# Patient Record
Sex: Female | Born: 1960 | Race: White | Hispanic: Yes | Marital: Married | Smoking: Never smoker
Health system: Southern US, Community
[De-identification: ages and names within clinical notes are randomized; demographics above are authoritative.]

## PROBLEM LIST (undated history)

## (undated) HISTORY — PX: TUBAL LIGATION: SHX77

---

## 1998-02-24 ENCOUNTER — Other Ambulatory Visit: Admission: RE | Admit: 1998-02-24 | Discharge: 1998-02-24 | Payer: Self-pay | Admitting: Obstetrics and Gynecology

## 1998-07-28 ENCOUNTER — Encounter: Admission: RE | Admit: 1998-07-28 | Discharge: 1998-10-26 | Payer: Self-pay | Admitting: Gynecology

## 1998-09-22 ENCOUNTER — Inpatient Hospital Stay (HOSPITAL_COMMUNITY): Admission: AD | Admit: 1998-09-22 | Discharge: 1998-09-25 | Payer: Self-pay | Admitting: Gynecology

## 1998-09-25 ENCOUNTER — Encounter (HOSPITAL_COMMUNITY): Admission: RE | Admit: 1998-09-25 | Discharge: 1998-12-24 | Payer: Self-pay | Admitting: Gynecology

## 1998-10-27 ENCOUNTER — Other Ambulatory Visit: Admission: RE | Admit: 1998-10-27 | Discharge: 1998-10-27 | Payer: Self-pay | Admitting: Gynecology

## 1999-12-25 ENCOUNTER — Inpatient Hospital Stay (HOSPITAL_COMMUNITY): Admission: AD | Admit: 1999-12-25 | Discharge: 1999-12-28 | Payer: Self-pay | Admitting: Obstetrics and Gynecology

## 1999-12-25 ENCOUNTER — Encounter (INDEPENDENT_AMBULATORY_CARE_PROVIDER_SITE_OTHER): Payer: Self-pay | Admitting: Specialist

## 2000-01-25 ENCOUNTER — Other Ambulatory Visit: Admission: RE | Admit: 2000-01-25 | Discharge: 2000-01-25 | Payer: Self-pay | Admitting: Obstetrics and Gynecology

## 2001-10-27 ENCOUNTER — Other Ambulatory Visit: Admission: RE | Admit: 2001-10-27 | Discharge: 2001-10-27 | Payer: Self-pay | Admitting: Obstetrics and Gynecology

## 2004-12-07 ENCOUNTER — Ambulatory Visit: Payer: Self-pay | Admitting: Internal Medicine

## 2004-12-07 ENCOUNTER — Other Ambulatory Visit: Admission: RE | Admit: 2004-12-07 | Discharge: 2004-12-07 | Payer: Self-pay | Admitting: Internal Medicine

## 2006-06-12 ENCOUNTER — Ambulatory Visit: Payer: Self-pay | Admitting: Internal Medicine

## 2007-06-30 ENCOUNTER — Ambulatory Visit: Payer: Self-pay | Admitting: Internal Medicine

## 2007-06-30 DIAGNOSIS — M722 Plantar fascial fibromatosis: Secondary | ICD-10-CM | POA: Insufficient documentation

## 2007-07-02 ENCOUNTER — Telehealth (INDEPENDENT_AMBULATORY_CARE_PROVIDER_SITE_OTHER): Payer: Self-pay | Admitting: *Deleted

## 2007-07-03 ENCOUNTER — Telehealth (INDEPENDENT_AMBULATORY_CARE_PROVIDER_SITE_OTHER): Payer: Self-pay | Admitting: *Deleted

## 2009-06-28 ENCOUNTER — Other Ambulatory Visit: Admission: RE | Admit: 2009-06-28 | Discharge: 2009-06-28 | Payer: Self-pay | Admitting: Gynecology

## 2009-06-28 ENCOUNTER — Ambulatory Visit: Payer: Self-pay | Admitting: Gynecology

## 2009-08-29 IMAGING — CR DG FOOT COMPLETE 3+V*L*
3 series · 3 of 3 positions shown · non-contrast
Comparison: none

CLINICAL DATA: Left plantar fasciitis, medial left heel pain for 4 months. 
 LEFT FOOT - 3 VIEW:

[view not recorded (1 of 3)]
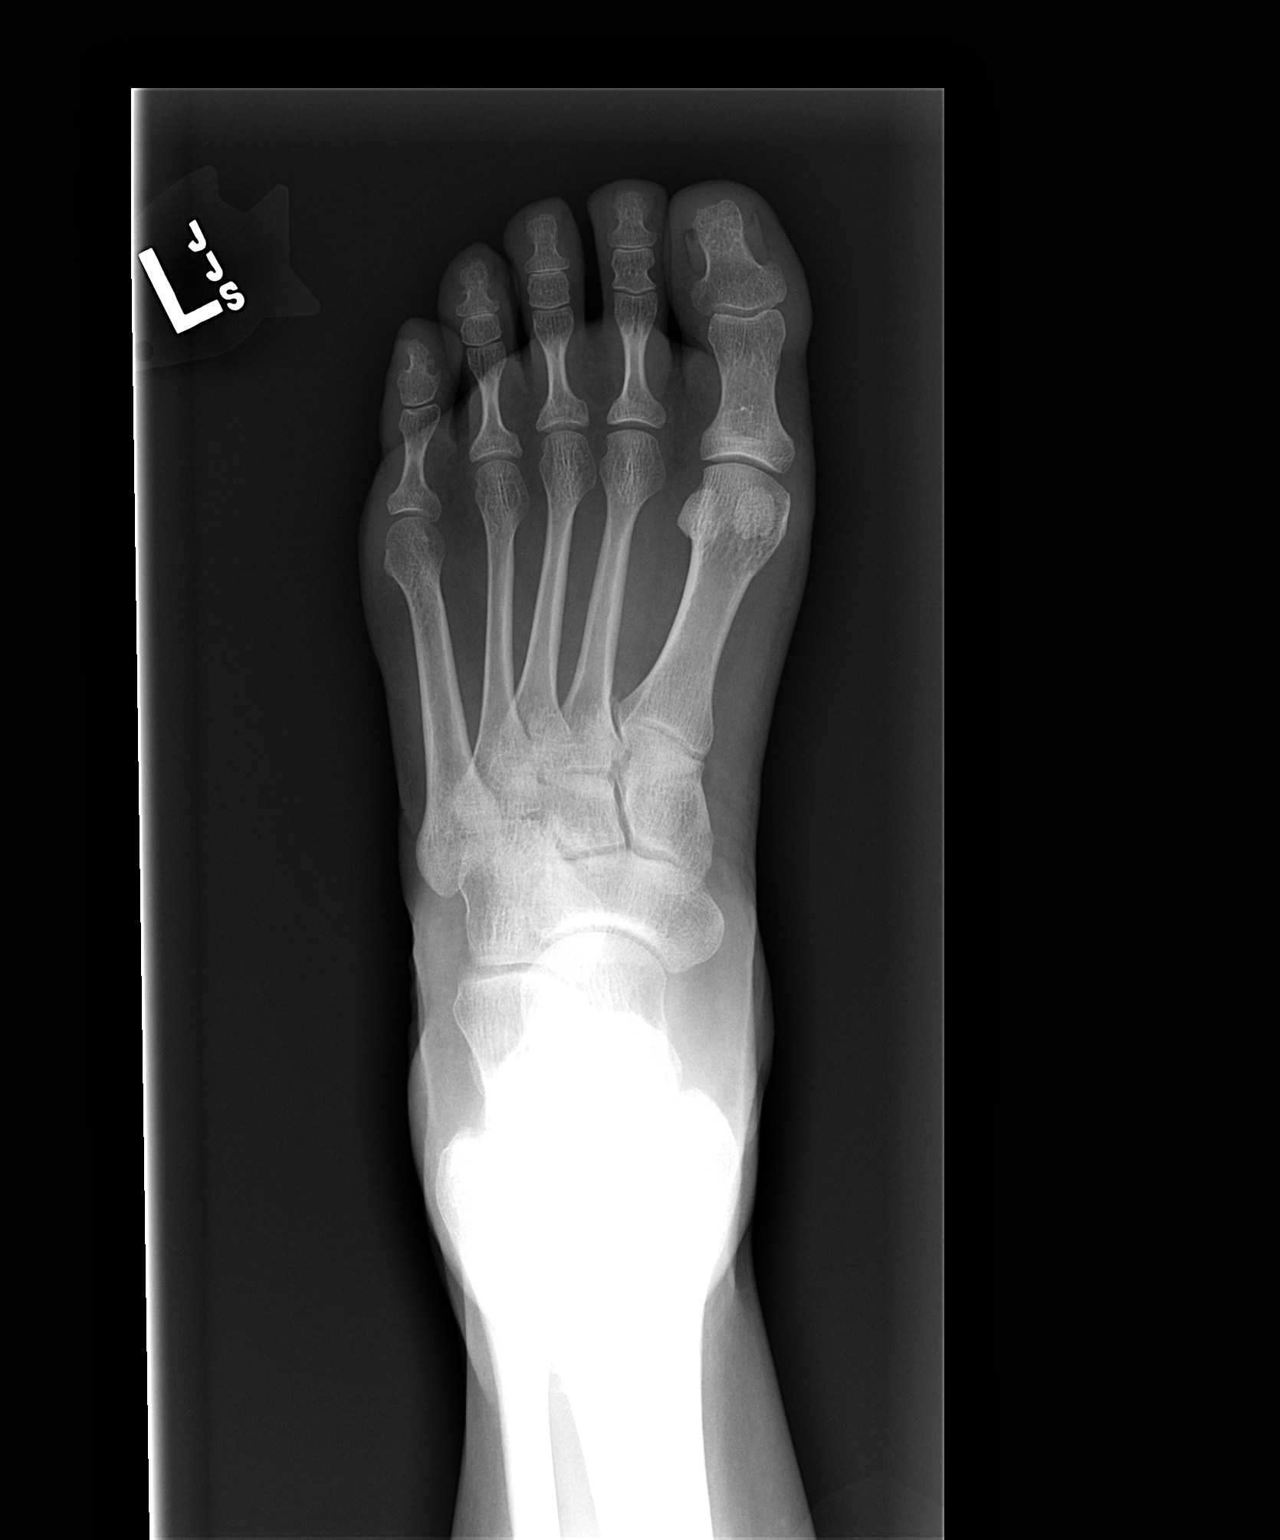

[view not recorded (2 of 3)]
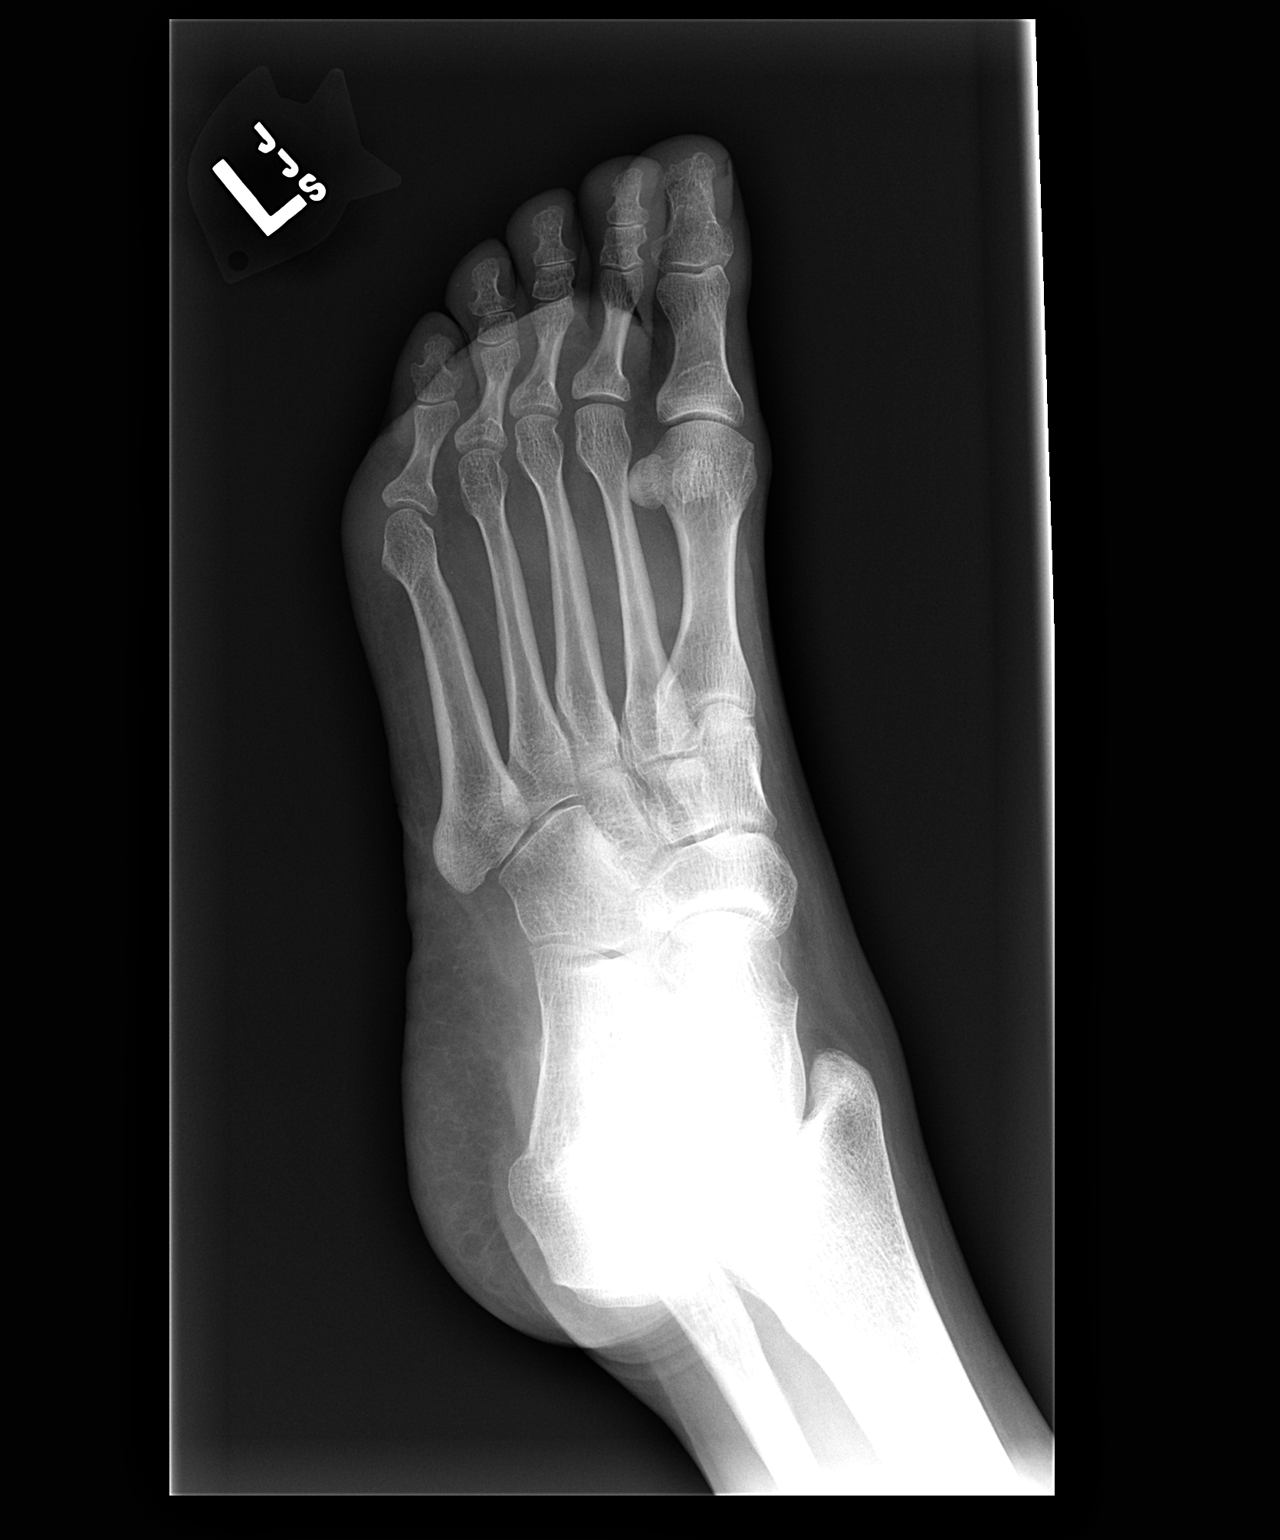

[view not recorded (3 of 3)]
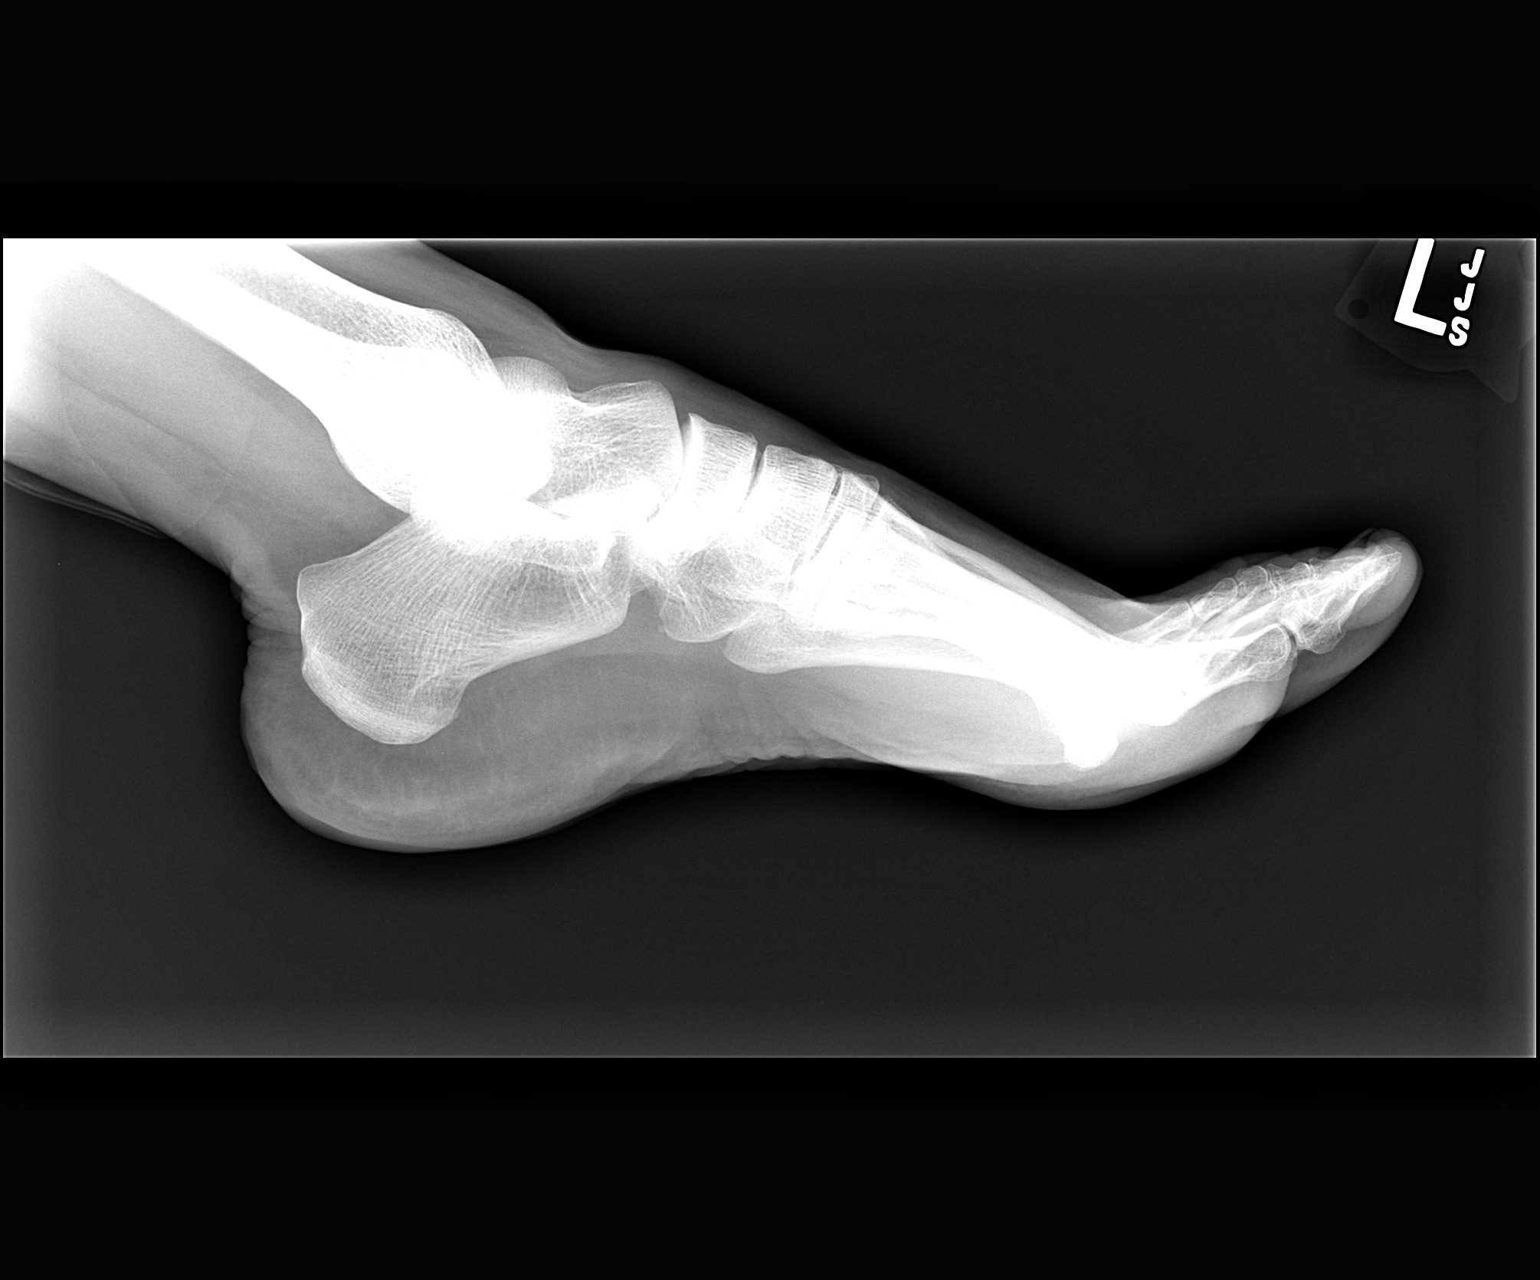

[3 of 3 positions shown; findings below may reference images not displayed]

FINDINGS: There is no evidence of fracture or dislocation.  There is no evidence of arthropathy or other focal bone abnormality.  Soft tissues are unremarkable.  No evidence of a calcaneal plantar bony spur.  IMPRESSION:
 Negative left foot.

## 2010-07-11 ENCOUNTER — Ambulatory Visit (INDEPENDENT_AMBULATORY_CARE_PROVIDER_SITE_OTHER): Payer: BC Managed Care – PPO | Admitting: Internal Medicine

## 2010-07-11 ENCOUNTER — Encounter: Payer: Self-pay | Admitting: Internal Medicine

## 2010-07-11 DIAGNOSIS — R110 Nausea: Secondary | ICD-10-CM | POA: Insufficient documentation

## 2010-07-17 ENCOUNTER — Telehealth: Payer: Self-pay | Admitting: Internal Medicine

## 2010-07-19 NOTE — Assessment & Plan Note (Signed)
Summary: vomiting all night, stomache///sph   Vital Signs:  Patient profile:   50 year old female Weight:      191 pounds Temp:     98.4 degrees F oral Pulse rate:   82 / minute Pulse rhythm:   regular BP sitting:   130 / 84  (left arm) Cuff size:   large  Vitals Entered By: Army Fossa CMA (July 11, 2010 11:57 AM) CC: Pt here c/o vomitting since yesterday- no diarrhea  Comments [facility] [address]   History of Present Illness:  sudden onset of nausea and vomiting yesterday  vomited few times   unable to tolerate food, she tried to drink milk and couldn't.  also some epigastric burning.  this morning she still feels slightly nauseated and  vomited once , yellow fluid.   Review of systems no known sick contacts that she can tell Denies any fevers No diarrhea No hematemesis or melena  feels slightly weak but not dizzy Has not taken any NSAIDs lately Prior to this episode , she did not have any heartburn.   Current Medications (verified): 1)  None  Allergies (verified): No Known Drug Allergies  Past History:  Past Medical History: healthy   Past Surgical History: Reviewed history from 06/30/2007 and no changes required. Caesarean section x 2 Tubal ligation  Social History: Married 2 kids from [place] ETOH-- rarely Tobacco--no  Physical Exam  General:  alert and well-developed.   no apparent distress Lungs:  normal respiratory effort, no intercostal retractions, no accessory muscle use, and normal breath sounds.   Heart:  normal rate, regular rhythm, and no murmur.   Abdomen:   nondistended, soft, moderate tenderness and epigastric area without mass or rebound , normal bowel sounds   Impression & Recommendations:  Problem # 1:  NAUSEA (ICD-787.02)  nausea and vomiting since yesterday, some epigastric pain DDX includes acute gastroenteritis, recent cases has been seen in the community. Other etiologies include PUD or a gallbladder problem.  plan is to take lots of fluids, treat symptoms with Zofran and Prilosec. If she is no better she knows to call   and further workup will be prescribed . Instructions provided in Spanish  Her updated medication list for this problem includes:    Zofran 4 Mg Tabs (Ondansetron hcl) .Marland Kitchen... 1 or 2 by mouth every 6 hours as neede for nauea  Complete Medication List: 1)  Zofran 4 Mg Tabs (Ondansetron hcl) .Marland Kitchen.. 1 or 2 by mouth every 6 hours as neede for nauea  Patient Instructions: 1)  dieta blanda, muchos liquidos 2)  si tiene nausea, tome zofran (ondansetron) 1 o 2 tabletas cada 6 horas  3)  prilosec 1 tableta al dia x 1 semana (samples) ; tomelo con el estomago vacio 4)  llame si se pone peor, tiene fiebre, aumenta el dolor  or no esta mejor en 1 o 2 dias  Prescriptions: ZOFRAN 4 MG TABS (ONDANSETRON HCL) 1 or 2 by mouth every 6 hours as neede for nauea  #20 x 0   Entered and Authorized by:   Elita Quick E. Raynee Mccasland MD   Signed by:   Nolon Rod. Rue Tinnel MD on 07/11/2010   Method used:   Print then Give to Patient   RxID:   [id number]    Orders Added: 1)  Est. Patient Level III [46962]

## 2010-07-24 NOTE — Progress Notes (Signed)
Summary: pt status  ---- Converted from flag ---- ---- 07/17/2010 8:06 AM, Cadi Rhinehart E. Iniko Robles MD wrote: please check on patient, Nausea, pain...better? ------------------------------  I called pt on home #, not working. I called the work # and I believe pt only speaks spanish, she could not understand me. Army Fossa CMA  July 17, 2010 2:29 PM  [facility], checking on patient, asked to call me if no better [facility] E. Hameed Kolar MD  July 17, 2010 4:05 PM

## 2010-09-28 NOTE — Discharge Summary (Signed)
[facility]  Patient:    Natalie [name]                     MRN: [id number] Adm. Date:  [id number] Disc. Date: [id number] Attending:  Ardeen Fillers                           Discharge Summary  ADMISSION DIAGNOSES:          1. Term intrauterine pregnancy.                               2. History of previous low transverse cesarean                                  section, desires repeat.                               3. Undesired fertility.  PROCEDURES:                   1. Repeat low transverse cesarean section.                               2. Bilateral tubal ligation.  HISTORY OF PRESENT ILLNESS:   For complete details, please see the history and physical in the chart.  However, in brief, Natalie [name] is a 50 year old Hispanic female, gravida 3, para 1, abortus 1, who presented at 38-5/7 weeks with spontaneous rupture of membranes at 0500 on the day of admission.  At that time, she was not contracting and reported active fetus and no vaginal bleeding.  Her prenatal care was at [facility].  Primary was Dr. Billy Coast. Complicated by advanced maternal age and weight entering the prenatal care at 23 weeks.  Otherwise, no problems.  Last menstrual period unsure.  EDC was based on a 23-week ultrasound.  PRENATAL LABORATORY DATA:     A positive.  Rubella equivocal.  Group B strep positive.  Hepatitis B and RPR negative.  Glucola 136.  HOSPITAL COURSE:              The patient was admitted for repeat cesarean section as she had declined a trial of labor.  She underwent repeat low transverse cesarean section on the day of admission and bilateral tubal ligation.  The procedure went without complications.  The patients postoperative course was notable for a rapid return of ability to ambulate, void, and tolerate a regular diet.  She remained afebrile, with stable vital signs throughout her stay.  She was discharged home on her third postoperative day in  satisfactory condition.  Her postoperative hemoglobin was 11.0.  She was again noted to be A positive and rubella equivocal.  Rubella vaccine will be given prior to discharge.  She plans to breast-feed and has had tubal ligation for birth control.  DISCHARGE INSTRUCTIONS:       Standardized postoperative instructions were given to the patient prior to discharge.  FOLLOW-UP:                    In four weeks in the office.  MEDICATIONS:  1. Prescription for Tylox 1-2 every four to six                                  hours as needed for pain.                               2. Instructed to continue prenatal vitamins. DD:  12/28/99 TD:  12/30/99 Job: [id number]

## 2010-09-28 NOTE — Op Note (Signed)
[facility]  Patient:    Natalie [name]                     MRN: [id number] Proc. Date: 12/25/99 Adm. Date:  [id number] Attending:  Ardeen Fillers                           Operative Report  PREOPERATIVE DIAGNOSES:       1. Intrauterine pregnancy at 38 5/7 weeks.                               2. Spontaneous rupture of membranes.                               3. History of previous low transverse cesarean                                  section; desires repeat.                               4. Undesired fertility.  POSTOPERATIVE DIAGNOSES:      1. Intrauterine pregnancy at 38 5/7 weeks.                               2. Spontaneous rupture of membranes.                               3. History of previous low transverse cesarean                                  section; desires repeat.                               4. Undesired fertility.                               5. Thin meconium.  PROCEDURE:                    Repeat low transverse cesarean section and bilateral tubal ligation, Pomeroy method.  SURGEON:                      Marina Gravel, M.D.  ASSISTANT:                    Sung Amabile. Roslyn Smiling, M.D.  ANESTHESIA:                   Spinal.  FINDINGS:                     Viable female infant.  Apgars 8 and 9.  Thin meconium.  Weight is pending at the nursery.  Normal uterus, tubes, and ovaries.  SPECIMEN:                     Segment of left and right tube.  COMPLICATIONS:  None.  DRAIN:                        Foley catheter.  ESTIMATED BLOOD LOSS:         1000 cc.  PROCEDURE:                    Patient was taken to the operating room and spinal anesthesia was obtained.  She was prepped and draped in a standard fashion in the supine position with a leftward tilt.                                A Pfannenstiel incision was made with a knife through the patients previous scar.  It was carried sharply with the knife to the  underlying fascia.  The fascia was divided in the midline and the incision extended laterally with Mayo scissors.  ______ clamps were used to elevate the superior aspect of the incision and the underlying rectus muscles were dissected off sharply.  Repeated inferiorly in a similar fashion.                                Midline and rectus muscles was identified.  The underlying peritoneum isolated, elevated with hemostats, and entered sharply with Metzenbaum scissors.  Extended superiorly and inferiorly with adequate visualization of the surrounding organs.                                A bladder blade was then inserted and the vesicouterine peritoneum identified, elevated, and bladder flap created with sharp and blunt technique.  A very thin lower uterine segment was noted; however, there was no obvious window.  The uterine incision was made sharply with the knife.  The incision was then extended laterally with bandage scissors.  On amniotomy thin meconium was noted.  The head was delivered through the incision and the nose and mouth suctioned with the ball suction. The remainder of the infant was delivered without difficulty.  The cord was clamped and cut and the infant delivered to the awaiting attendants.                                Placenta was removed by expression and the uterus exteriorized and cleared of all clots and debris.  The incision was inspected and was free of extension.  The incision was then closed in a running locked stitch of 0 Monocryl.  Hemostasis was obtained.                                The left tube was then identified and followed to its fimbriated end.  Tube was then grasped with the Babcock clamp approximately 2 cm distal to the cornua.  A 2 cm segment of the tube was then isolated, doubly tied with a plain suture, and divided sharply with Metzenbaum scissors.  Hemostasis was obtained with a Bovie cautery.  Similar procedure repeated on the opposite  side.                                The  uterus was turned to the abdomen and the pelvis was irrigated.  The uterine incision and tubal incisions were reinspected and were hemostatic.  Subfascial space was inspected and was hemostatic as was the bladder flap.                                The fascia was then closed in a running stitch of 0 Vicryl.  The subcutaneous tissue was irrigated and made hemostatic with the Bovie.  Skin was closed with staples.                                The patient tolerated the procedure well and there were no complications.  She was taken to the recovery room in awake, alert, and in stable condition.  All counts were correct ______.DD:  12/25/99 TD:  12/25/99 Job: [id number]

## 2012-02-25 ENCOUNTER — Encounter: Payer: Self-pay | Admitting: Gynecology

## 2012-02-25 ENCOUNTER — Ambulatory Visit (INDEPENDENT_AMBULATORY_CARE_PROVIDER_SITE_OTHER): Payer: BC Managed Care – PPO | Admitting: Gynecology

## 2012-02-25 ENCOUNTER — Other Ambulatory Visit (HOSPITAL_COMMUNITY)
Admission: RE | Admit: 2012-02-25 | Discharge: 2012-02-25 | Disposition: A | Discharge status: Home / Self Care | Payer: BC Managed Care – PPO | Source: Ambulatory Visit | Attending: Gynecology | Admitting: Gynecology

## 2012-02-25 VITALS — BP 128/78 | Ht 62.0 in | Wt 192.0 lb

## 2012-02-25 DIAGNOSIS — Z01419 Encounter for gynecological examination (general) (routine) without abnormal findings: Secondary | ICD-10-CM

## 2012-02-25 DIAGNOSIS — R635 Abnormal weight gain: Secondary | ICD-10-CM

## 2012-02-25 DIAGNOSIS — Z833 Family history of diabetes mellitus: Secondary | ICD-10-CM

## 2012-02-25 DIAGNOSIS — Z1151 Encounter for screening for human papillomavirus (HPV): Secondary | ICD-10-CM | POA: Insufficient documentation

## 2012-02-25 LAB — CBC WITH DIFFERENTIAL/PLATELET
Hemoglobin: 14.1 g/dL (ref 12.0–15.0)
Lymphocytes Relative: 28 % (ref 12–46)
MCHC: 32.6 g/dL (ref 30.0–36.0)
Neutro Abs: 3.6 10*3/uL (ref 1.7–7.7)
Neutrophils Relative %: 65 % (ref 43–77)
RDW: 13.8 % (ref 11.5–15.5)
WBC: 5.5 10*3/uL (ref 4.0–10.5)

## 2012-02-25 LAB — HEMOGLOBIN A1C
Hgb A1c MFr Bld: 5.3 % (ref <5.7)
Mean Plasma Glucose: 105 mg/dL (ref <117)

## 2012-02-25 NOTE — Patient Instructions (Addendum)
Vacuna contra la difteria, el ttanos, y la tos ferina Lo que usted necesita saber (Diphtheria, Tetanus, and Pertussis [DTaP] Vaccine) POR QU VACUNARSE? La difteria, el ttano y la tos [place] son enfermedades graves provocadas por bacterias. La difteria y la tos [place] se [place] de persona a Social worker. El ttano ingresa al cuerpo a travs de cortes o heridas. La difteria produce un recubrimiento denso en la parte trasera de la garganta.  Puede producir problemas para respirar, parlisis, insuficiencia cardaca, e incluso la muerte. El ttanos causa contracturas dolorosas de los msculos, a menudo en todo el cuerpo.  Puede ocasionar un "bloqueo" de la [place], de modo que es imposible abrir la boca o tragar. El ttanos produce la muerte en alrededor de 2 cada 10 casos. La tos ferina produce ataques de tos tan fuertes que, en nios, imposibilita comer, beber o respirar. Estos ataques pueden durar semanas.  Puede producir neumona, convulsiones (ataques de espasmos o ausencias), dao cerebral, y la muerte. La vacuna para la difteria, el ttano y la tos [place] (DTPa) puede ayudar a Market researcher. La mayor parte de los nios que la reciben estarn protegidos durante toda su niez. Muchos ms nios padeceran estas enfermedades si no fueran vacunados. La DTPa es una versin ms segura de una vacuna anterior denominada DTP. La DTP se ha dejado de [employer]. QUIN DEBE RECIBIR ESTA VACUNA Y CUNDO? Los nios deberan recibir 5 dosis de la vacuna DTPa, una dosis en cada una de las siguientes edades:  2 meses.  4 meses.  6 meses.  15 a 18 meses.  4 a 6 aos. La vacuna DTPa puede darse en simultneo con otras vacunas. ALGUNOS NIOS NO DEBERAN DARSE LA VACUNA DTPA O DEBERAN ESPERAR  Aquellos nios con trastornos menores, tales como resfros, pueden ser vacunados, pero aquellos con trastornos moderados a graves deberan esperar hasta su recuperacin para  recibir la vacuna DTPa.  Cualquier nio que haya tenido una reaccin alrgica grave luego de una dosis de DTPa no debera recibir otra dosis.  Cualquier nio que haya sufrido una enfermedad cerebral o del sistema nervioso luego de [address]  [address] de haber recibido una dosis de la vacuna DTPa, no debera recibir otra dosis.  Hable con el mdico si el nio:  Ha tenido convulsiones o sufri un colapso luego de una dosis de DTPa.  Ha llorado sin parar durante 3 horas o ms luego de una dosis de DTPa.  Ha tenido fiebre mayor a 105 F (40.6 C) luego de una dosis de DTPa.  Pida ms informacin al profesional que lo asiste. Algunos de estos nios podrn recibir una vacuna que no protege para la tos [place], [place] DT. NIOS DE MAYOR EDAD Y ADULTOS  La vacuna DTPa no se administra en adolescentes, adultos, o nios mayores a los 7 aos de [place].  Sin embargo, Therapist, art an requieren proteccin. Existe una vacuna llamada Tdap, que es similar a la DTPa. Se recomienda una dosis nica de Tdap en personas desde los 11 a los 64 aos de [place]. Otra vacuna, llamada Td, provee proteccin contra el ttanos y la difteria, pero no contra la tos [place]. Se recomienda su aplicacin cada 10 aos. CULES SON LOS RIESGOS DE LA VACUNA DTPA?  Enfermarse de difteria, ttanos o pertusis es mucho ms peligroso que recibir la vacuna DTPa.  Sin embargo, una [place], como cualquier otro medicamento, puede causar problemas serios, como Therapist, art graves. El riesgo de que la vacuna DTPa cause daos  graves o la muerte es extremadamente pequeo. Problemas leves (comunes)  Fiebre (en hasta 1 de cada 4 nios).  Enrojecimiento o inflamacin en el lugar en el que se dio la inyeccin (en hasta 1 de cada 4 nios).  Dolor o sensibilidad en Immunologist en el que se dio la inyeccin (en hasta 1 de cada 4 nios). Estos problemas ocurren ms a menudo luego de la cuarta y [place] dosis de vacuna DTPa que en las dosis anteriores. En  ocasiones luego de la cuarta o quinta dosis se observa la inflamacin de la pierna o brazo completo en que se ha dado la inyeccin, y puede durar de 1 a 7 das (en hasta 1 nio de cada 30). Otros problemas leves incluyen:  Irritabilidad (en hasta 1 de cada 3 nios).  Cansancio o falta de apetito (en hasta 1 de cada 10 nios).  Vmitos (en hasta 1 de cada 50 nios). Estos problemas ocurren generalmente de 1 a 3 das luego de la inyeccin. Problemas moderados (poco frecuentes)  Convulsiones (sacudones o fijacin de la mirada) (en hasta 1 de cada 14.000 nios).  Llanto sin parar durante 3 horas o ms (en hasta 1 nio de cada 1.000).  Fiebre alta, mayor a 105 F (40.6 C) (alrededor de 1 nio cada 16.000). Problemas graves (muy raros)  Automotive engineer grave (menos de 1 por milln de dosis).  Se han informado varios otros problemas graves luego de la aplicacin de la vacuna DTPa. Estos incluyen:  Convulsiones a largo plazo, coma, o reduccin de la conciencia.  Dao permanente al cerebro. Estos son casos tan poco frecuentes que resulta difcil saber si fueron provocados por la vacuna. Controlar la fiebre es particularmente importante para los nios que han tenido convulsiones, por cualquier motivo. Tambin es importante si otro miembro de la familia ha tenido convulsiones. Puede reducir la fiebre y el dolor dando al nio un analgsico sin aspirina al recibir la vacuna, y durante las siguientes 24 horas, segn las instrucciones del [place]. QU PASA SI HAY UNA REACCIN MODERADA O GRAVE? A qu debo prestar atencin? Cualquier cosa extraa o poco comn, como una reaccin alrgica, fiebre alta o comportamiento extrao. Es muy poco comn que ocurran reacciones alrgicas graves con cualquier vacuna. Si se produjera una, sera dentro de los primeros minutos hasta algunas horas luego de la inyeccin. Podr observar dificultad para respirar, ronquera o silbidos al respirar, ronchas, palidez,  debilidad, frecuencia cardaca elevada, o mareos. Si ocurrieran fiebre o convulsiones, normalmente sera dentro de la primera semana luego de la inyeccin. Qu debo hacer?  Comunquese con el mdico o lleve inmediatamente a la persona a un mdico.  Diga al mdico lo que ocurri, la fecha y hora en que ocurri, y cundo recibi la vacuna.  Pida al mdico, enfermera, o al servicio de salud que complete el informe [employer] efectos adversos de la vacuna (Vaccine Adverse Event Reporting System, VAERS). O, bien puede completar el informe a travs del sitio web de VAERS en www.vaers.LAgents.no o llamando al [phone]. VAERS no proporciona consejos mdicos. EL PROGRAMA NACIONAL DE COMPENSACIN POR LESIONES CAUSADAS POR VACUNAS (NATIONAL VACCINE INJURY COMPENSATION PROGRAM)  En el raro caso en que usted o su hijo hayan tenido una reaccin grave a Cathleen Corti, se ha creado un programa federal para ayudarlo a Network engineer atencin de los lesionados.  Para obtener detalles acerca del [place] de Compensacin por Lesiones Causadas por [place], llame al [phone] o visite el sitio web del programa en SpiritualWord.at  CMO OBTENER MS INFORMACIN?  Consulte con el profesional que lo asiste. Podr darle el prospecto de la vacuna o sugerirle otras fuentes de informacin.  Llame al programa de vacunacin del departamento de salud local o estatal.  Comunquese con los Centers for [employer] and Prevention (Centros para el control y la prevencin de enfermedades, CDC).  Llame al [phone] (1-800-CDC-INFO).  Visite el sitio web del [employer] de [place], en PicCapture.uy CDC Diphtheria, Tetanus, and Pertussis-Spanish VIS (09/26/05) Document Released: 07/26/2008 Document Revised: 07/22/2011 [facility] Patient Information 2013 [place], [place].  Colonoscopa (Colonoscopy) El profesional que lo asiste le ha ordenado una colonoscopa. Es un examen para  evaluar el colon en su totalidad. Durante este examen se limpia el colon. Luego se inserta un tubo largo de fibras pticas en el recto y el colon. El tubo de fibras pticas (fibroscopio, endoscopio) es un largo haz de fibras unidas y [place] flexibles. Estas fibras transmiten [place] hacia la zona examinada y envan las imgenes para que el profesional las observe. Las molestias son mnimas. Podrn administrarle un sedante suave que lo ayudar a dormir antes y Teacher, adult education. Este examen tambin ayuda a Engineer, manufacturing bultos (tumores), plipos, inflamacin y reas de [place]. El profesional extraer una pequea pieza de tejido (biopsia) que ser examinada en el microscopio. INFORME AL PROFESIONAL SOBRE LOS SIGUIENTES PUNTOS:  Alergias.  Medicamentos que toma, incluyendo hierbas, gotas oftlmicas, medicamentos de venta libre y cremas.  Uso de esteroides (por va oral o cremas).  Problemas anteriores debido a anestsicos.  Posibilidad de embarazo, si correspondiera.  Antecedentes de haber tenido cogulos sanguneos (tromboflebitis) o antecedentes de hemorragias o problemas sanguneos.  Cirugas previas.  Otros problemas de [place]. ANTES DEL PROCEDIMIENTO  [facility] antes del estudio deber someterse a [place]  Consulte a su mdico si debe cambiar o suspender los medicamentos que toma habitualmente.  Le requerirn que se aplique enemas o tome un purgante.  Tendr que beber una gran cantidad de solucin electroltica durante un breve perodo de [place]. Esta solucin ayuda a limpiar el colon.  Deber presentarse 60 minutos antes del procedimiento a menos que el profesional que lo asiste le indique otra cosa. DESPUS DEL PROCEDIMIENTO  Si le administraron un sedante o un analgsico, deber solicitar a alguna persona que lo lleve hasta su casa.  En algunos casos se observar un pequeo cogulo la primera vez que haga la deposicin. NO debe preocuparse. AVERIGUE LOS RESULTADOS DE SU  ESTUDIO Durante su visita no contar con todos los [employer]. En este caso, tenga otra entrevista con su mdico para conocerlos. No piense que el resultado es normal si no tiene noticias de su mdico o de la institucin mdica. Es Copy seguimiento de todos los [place] de [place]. INSTRUCCIONES PARA EL CUIDADO DOMICILIARIO  No es infrecuente eliminar una cantidad moderada de gases y experimentar ligeros clicos abdominales luego del procedimiento. Esto se debe al aire que se le ha insuflado en el colon durante el examen. [place] o colquese una almohadilla trmica en el abdomen (vientre).  Podr retornar a sus comidas y actividades habituales despus que se le pase el efecto de los sedantes y los medicamentos.  Utilice los medicamentos de venta libre o de prescripcin para Chief Technology Officer, Environmental health practitioner o la [place], segn se lo indique el profesional que lo asiste. NO tome aspirina o anticoagulantes si le tomaron una biopsia. Consulte al profesional que lo asiste acerca del uso de medicamentos en caso que le hayan  practicado una biopsia. SOLICITE ATENCIN MDICA DE INMEDIATO SI:  Tiene fiebre.  Elimina grandes cogulos o elimina sangre luego del procedimiento. Esto puede sucederle hasta 10 a 14 das posteriores al procedimiento. Es ms probable que ocurra si le han practicado una biopsia.  Siente dolor abdominal que empeora y no puede aliviarse con los medicamentos. Document Released: 02/06/2005 Document Revised: 07/22/2011 [facility] Patient Information 2013 [place], [place].

## 2012-02-25 NOTE — Addendum Note (Signed)
Addended by: Bertram Savin A on: 02/25/2012 10:23 AM   Modules accepted: Orders

## 2012-02-25 NOTE — Progress Notes (Signed)
Natalie [name] 1961-04-06 [id number]   History:    51 y.o.  for annual gyn exam with no complaints today. Patient has not been seen in the office in 2 years. Patient with prior history of abnormal Pap smear. Patient reached the menopause 2 years ago in his not had any vasomotor symptoms. She does her monthly self breast examination. She stated her last mammogram was over 2 years ago in [place]. Her father's and on insulin diabetic. Patient declined flu shot today. Patient uncertain of her DTaP status.  Past medical history,surgical history, family history and social history were all reviewed and documented in the EPIC chart.  Gynecologic History Patient's last menstrual period was 02/24/2010. Contraception: none and Menopausal Last Pap: 2011. Results were: normal Last mammogram: 2011. Results were: Patient states it was normal in [place]  Obstetric History OB History    Grav Para Term Preterm Abortions TAB SAB Ect Mult Living   2 2 2       2      # Outc Date GA Lbr Len/2nd Wgt Sex Del Anes PTL Lv   1 TRM     F SVD  No Yes   2 TRM     F SVD  No Yes       ROS: A ROS was performed and pertinent positives and negatives are included in the history.  GENERAL: No fevers or chills. HEENT: No change in vision, no earache, sore throat or sinus congestion. NECK: No pain or stiffness. CARDIOVASCULAR: No chest pain or pressure. No palpitations. PULMONARY: No shortness of breath, cough or wheeze. GASTROINTESTINAL: No abdominal pain, nausea, vomiting or diarrhea, melena or bright red blood per rectum. GENITOURINARY: No urinary frequency, urgency, hesitancy or dysuria. MUSCULOSKELETAL: No joint or muscle pain, no back pain, no recent trauma. DERMATOLOGIC: No rash, no itching, no lesions. ENDOCRINE: No polyuria, polydipsia, no heat or cold intolerance. No recent change in weight. HEMATOLOGICAL: No anemia or easy bruising or bleeding. NEUROLOGIC: No headache, seizures,  numbness, tingling or weakness. PSYCHIATRIC: No depression, no loss of interest in normal activity or change in sleep pattern.     Exam: chaperone present  BP 128/78  Ht 5\' 2"  (1.575 m)  Wt 192 lb (87.091 kg)  BMI 35.12 kg/m2  LMP 02/24/2010  Body mass index is 35.12 kg/(m^2).  General appearance : Well developed well nourished female. No acute distress HEENT: Neck supple, trachea midline, no carotid bruits, no thyroidmegaly Lungs: Clear to auscultation, no rhonchi or wheezes, or rib retractions  Heart: Regular rate and rhythm, no murmurs or gallops Breast:Examined in sitting and supine position were symmetrical in appearance, no palpable masses or tenderness,  no skin retraction, no nipple inversion, no nipple discharge, no skin discoloration, no axillary or supraclavicular lymphadenopathy Abdomen: no palpable masses or tenderness, no rebound or guarding Extremities: no edema or skin discoloration or tenderness  Pelvic:  Bartholin, Urethra, Skene Glands: Within normal limits             Vagina: No gross lesions or discharge  Cervix: No gross lesions or discharge  Uterus  anteverted, normal size, shape and consistency, non-tender and mobile  Adnexa  Without masses or tenderness  Anus and perineum  normal   Rectovaginal  normal sphincter tone without palpated masses or tenderness             Hemoccult cards provided     Assessment/Plan:  51 y.o. female for annual exam who will need to schedule a screening  colonoscopy. Names of gastroenterologist provided. Patient will check with her primary physician on her DTaP status. We did discuss the new Pap smear screening guidelines. The following labs were ordered today: CBC, screening cholesterol, hemoglobin A1c, TSH, urinalysis and Pap smear. We discussed importance of calcium and vitamin D and regular exercise for osteoporosis prevention. Literature information on diet and exercise, colonoscopy, and on DTaP was provided in [place]. Patient  was provided with a requisition to schedule her mammogram and was encouraged to do her monthly self breast examination. Next year will need to do a bone density study.    Ok Edwards MD, 10:12 AM 02/25/2012

## 2012-02-26 ENCOUNTER — Other Ambulatory Visit: Payer: Self-pay | Admitting: Gynecology

## 2012-02-26 DIAGNOSIS — E7800 Pure hypercholesterolemia, unspecified: Secondary | ICD-10-CM

## 2012-02-26 LAB — URINALYSIS W MICROSCOPIC + REFLEX CULTURE
Bacteria, UA: NONE SEEN
Bilirubin Urine: NEGATIVE
Casts: NONE SEEN
Crystals: NONE SEEN
Ketones, ur: NEGATIVE mg/dL
Specific Gravity, Urine: 1.019 (ref 1.005–1.030)
Urobilinogen, UA: 0.2 mg/dL (ref 0.0–1.0)

## 2012-03-03 ENCOUNTER — Encounter: Payer: BC Managed Care – PPO | Admitting: Gynecology

## 2012-03-04 ENCOUNTER — Other Ambulatory Visit: Payer: Self-pay | Admitting: Gynecology

## 2012-03-04 ENCOUNTER — Other Ambulatory Visit: Payer: BC Managed Care – PPO

## 2012-03-04 DIAGNOSIS — E7800 Pure hypercholesterolemia, unspecified: Secondary | ICD-10-CM

## 2012-03-04 LAB — LIPID PANEL: HDL: 43 mg/dL (ref >39)

## 2012-03-06 LAB — COMPREHENSIVE METABOLIC PANEL
ALT: 17 U/L (ref 0–35)
AST: 17 U/L (ref 0–37)
CO2: 18 mEq/L — ABNORMAL LOW (ref 19–32)
Glucose, Bld: 98 mg/dL (ref 70–99)
Potassium: 4.3 mEq/L (ref 3.5–5.3)
Sodium: 143 mEq/L (ref 135–145)

## 2012-03-17 ENCOUNTER — Encounter: Payer: Self-pay | Admitting: Gynecology

## 2012-03-17 ENCOUNTER — Ambulatory Visit (INDEPENDENT_AMBULATORY_CARE_PROVIDER_SITE_OTHER): Payer: BC Managed Care – PPO | Admitting: Gynecology

## 2012-03-17 VITALS — BP 126/78

## 2012-03-17 DIAGNOSIS — E785 Hyperlipidemia, unspecified: Secondary | ICD-10-CM

## 2012-03-17 MED ORDER — ATORVASTATIN CALCIUM 10 MG PO TABS: 10.0000 mg | ORAL_TABLET | Freq: Every day | ORAL | Status: DC

## 2012-03-17 NOTE — Patient Instructions (Addendum)
Control del colesterol  Los niveles de colesterol en el organismo estn determinados significativamente por su dieta. Los niveles de colesterol tambin se relacionan con la enfermedad cardaca. El material que sigue ayuda a Software engineer relacin y a Chiropractor qu puede hacer para mantener su corazn sano. No todo el colesterol es [place]. Las lipoprotenas de baja densidad (LDL) forman el colesterol "malo". El colesterol malo puede ocasionar depsitos de grasa que se acumulan en el interior de las arterias. Las lipoprotenas de alta densidad (HDL) es el colesterol "bueno". Ayuda a remover el colesterol LDL "malo" de la [place]. El colesterol es un factor de riesgo muy importante para la enfermedad cardaca. Otros factores de riesgo son la hipertensin arterial, el hbito de fumar, el estrs, la herencia y [place].  El msculo cardaco obtiene el suministro de sangre a travs de las arterias coronarias. Si su colesterol LDL ("malo") est elevado y el HDL ("bueno") es bajo, tiene un factor de riesgo para que se formen depsitos de Holiday representative en las arterias coronarias (los vasos sanguneos que suministran sangre al corazn). Esto hace que haya menos lugar para que la sangre circule. Sin la suficiente sangre y oxgeno, el msculo cardaco no puede funcionar correctamente, y usted podr sentir dolores en el pecho (angina pectoris). Cuando una arteria coronaria se cierra completamente, una parte del msculo cardaco puede morir (infarto de miocardio). CONTROL DEL COLESTEROL Cuando el profesional que lo asiste enva la sangre al laboratorio para Artist nivel de colesterol, puede realizarle tambin un perfil completo de los lpidos. Con esta prueba, se puede determinar la cantidad total de colesterol, as como los niveles de LDL y HDL. Los triglicridos son un tipo de grasa que circula en la sangre y que tambin puede utilizarse para determinar el riesgo de enfermedad  cardaca. En la siguiente tabla se establecen los nmeros ideales: Prueba: Colesterol total  Menos de 200 mg/dl.  Prueba: LDL "colesterol malo"  Menos de 100 mg/dl.   Menos de 70 mg/dl si tiene riesgo muy elevado de sufrir un ataque cardaco o muerte cardaca sbita.  Prueba: HDL "colesterol bueno"  Mujeres: Ms de 50 mg/dl.   Hombres: Ms de 40 mg/dl.  Prueba: Trigliceridos  Menos de 150 mg/dl.  CONTROL DEL COLESTEROL CON DIETA Aunque factores como el ejercicio y el estilo de vida son importantes, la "primera lnea de ataque" es la dieta. Esto se debe a que se sabe que ciertos alimentos hacen subir el colesterol y otros lo [place]. El objetivo debe ser [employer] alimentos, de modo que tengan un efecto sobre el colesterol y, an ms importante, Microbiologist las grasas saturadas y trans con otros tipos de grasas, como las monoinsaturadas y las poliinsaturadas y cidos grasos omega-3 . En promedio, una persona no debe consumir ms de 15 a 17 g de grasas saturadas por [employer]. Las grasas saturadas y trans se consideran grasas "malas", ya que elevan el colesterol LDL. Las grasas saturadas se encuentran principalmente en productos animales como carne, Miles y crema. Pero esto no significa que usted Marketing executive todas sus comidas favoritas. Actualmente, como lo muestra el cuadro que figura al final de este documento, hay sustitutos de buen sabor, bajos en grasas y en colesterol, para la mayora de los alimentos que a usted Musician. Elija aquellos alimentos alternativos que sean bajos en grasas o sin grasas. Elija cortes de carne del cuarto trasero o lomo ya que estos cortes son los que tienen menor cantidad de grasa y Oncologist. El pollo (  sin piel), el pescado, la carne de ternera, y la [place] molida son excelentes opciones. Elimine las carnes [employer] o el salami. Los [employer] o nada de grasas saturadas. Cuando consuma carne [place], carne de aves de  corral, o pescado, hgalo en porciones de 85 gramos (3 onzas). Las grasas trans tambin se llaman "aceites parcialmente hidrogenados". Son aceites manipulados cientficamente de [place] que son slidos a Publishing rights manager, tienen una larga vida y Glass blower/designer sabor y la textura de los alimentos a los que se Scientist, clinical (histocompatibility and immunogenetics). Las grasas trans se encuentran en la [place], [place], crackers y alimentos horneados.  Para hornear y cocinar, el aceite es un excelente sustituto para la [place]. Los aceites monoinsaturados tienen un beneficio particular, ya que se cree que disminuyen el colesterol LDL (colesterol malo) y elevan el HDL. Deber evitar los aceites tropicales saturados como el de coco y el de [place].  Recuerde, adems, que puede comer sin restricciones los grupos de alimentos que son naturalmente libres de grasas saturadas y Neurosurgeon trans, entre los que se incluyen el pescado, las frutas (excepto el [place]), verduras, frijoles, cereales (cebada, arroz, [place], trigo) y las pastas (sin salsas con crema)  IDENTIFIQUE LOS ALIMENTOS QUE DISMINUYEN EL COLESTEROL  Pueden disminuir el colesterol las fibras solubles que estn en las frutas, como las [place], en los vegetales como el brcoli, las patatas y las zanahorias; en las legumbres como frijoles, guisantes y Therapist, occupational; y en los cereales como la cebada. Los alimentos fortificados con fitosteroles tambin Engineer, production. Debe consumir al menos 2 g de estos alimentos a diario para Financial planner de disminucin de [place].  En el supermercado, lea las etiquetas de los envases para identificar los alimentos bajos en grasas saturadas, libres de grasas trans y bajos en [place], . Elija quesos que tengan solo de 2 a 3 g de grasa saturada por onza (28,35 g). Use una margarina que no dae el corazn, [place] de grasas trans o aceite parcialmente hidrogenado. Al comprar alimentos horneados (galletitas dulces y Gaffer) evite el aceite parcialmente  hidrogenado. Los panes y bollos debern ser de granos enteros (harina de maz o de avena entera, en lugar de "harina" o "harina enriquecida"). Compre sopas en lata que no sean cremosas, con bajo contenido de sal y sin grasas adicionadas.  TCNICAS DE PREPARACIN DE LOS ALIMENTOS  Nunca fra los alimentos en aceite abundante. Si debe frer, hgalo en poco aceite y removiendo [place], porque as se utilizan muy pocas grasas, o utilice un spray antiadherente. Cuando le sea posible, hierva, hornee o ase las carnes y cocine los vegetales al vapor. En vez de [employer] con mantequilla o [place], utilice limn y hierbas, pur de Psychologist, educational y canela (para las calabazas y batatas), yogurt y salsa descremados y aderezos para ensaladas bajos en contenido graso.  BAJO EN GRASAS SATURADAS / SUSTITUTOS BAJOS EN GRASA  Carnes / Grasas saturadas (g)  Evite: Bife, corte graso (3 oz/85 g) / 11 g   Elija: Bife, corte magro (3 oz/85 g) / 4 g   Evite: [place]a (3 oz/85 g) / 7 g   Elija:  [place] magra (3 oz/85 g) / 5 g   Evite: Jamn (3 oz/85 g) / 6 g   Elija:  Jamn magro (3 oz/85 g) / 2.4 g   Evite: Pollo, con piel (3 oz/85 g), Carne oscura / 4 g   Elija:  Pollo, sin piel (3 oz/85 g), Carne oscura / 2  g   Evite: Pollo, con piel (3 oz/85 g), Carne magra / 2.5 g   Elija: Pollo, sin piel (3 oz/85 g), Carne magra / 1 g  Lcteos / Grasas saturadas (g)  Evite: Leche entera (1 taza) / 5 g   Elija: Leche con bajo contenido de grasa, 2% (1 taza) / 3 g   Elija: Leche con bajo contenido de grasa, 1% (1 taza) / 1.5 g   Elija: Leche descremada (1 taza) / 0.3 g   Evite: Queso duro (1 oz/28 g) / 6 g   Elija: Queso descremado (1 oz/28 g) / 2-3 g   Evite: Queso cottage, 4% grasa (1 taza)/ 6.5 g   Elija: Queso cottage con bajo contenido de grasa, 1% grasa (1 taza)/ 1.5 g   Evite: Helado (1 taza) / 9 g   Elija: Sorbete (1 taza) / 2.5 g   Elija: Yogurt helado sin contenido de grasa  (1 taza) / 0.3 g   Elija: Barras de fruta congeladas / vestigios   Evite: Crema batida (1 cucharada) / 3.5 g   Elija: Batidos glac sin lcteos (1 cucharada) / 1 g  Condimentos / Grasas saturadas (g)  Evite: Mayonesa (1 cucharada) / 2 g   Elija: Mayonesa con bajo contenido de grasa (1 cucharada) / 1 g   Evite: [place] (1 cucharada) / 7 g   Elija: Margarina extra light (1 cucharada) / 1 g   Evite: Aceite de coco (1 cucharada) / 11.8 g   Elija: Aceite de oliva (1 cucharada) / 1.8 g   Elija: Aceite de maz (1 cucharada) / 1.7 g   Elija: Aceite de crtamo (1 cucharada) / 1.2 g   Elija: Aceite de girasol (1 cucharada) / 1.4 g   Elija: Aceite de soja (1 cucharada) / 2.4 g   Elija: Aceite de canola (1 cucharada) / 1 g  Document Released: 04/29/2005 Document Revised: 01/09/2011 [facility] Patient Information 2012 [place], [place].  Ejercicios para perder peso (Exercise to Lose Weight) La actividad fsica y [place] Dear dieta saludable ayudan a perder peso. El mdico podr sugerirle ejercicios especficos. IDEAS Y CONSEJOS PARA HACER EJERCICIOS  Elija opciones econmicas que disfrute hacer , como caminar, andar en bicicleta o los vdeos para ejercitarse.   Utilice las Microbiologist del ascensor.   Camine durante la hora del almuerzo.   Estacione el auto lejos del lugar de [place] o [place].   Concurra a un gimnasio o tome clases de gimnasia.   Comience con 5  10 minutos de actividad fsica por da. Ejercite hasta 30 minutos, 4 a 6 das por [address]   Utilice zapatos que tengan un buen soporte y ropas cmodas.   Elongue antes y despus de Company secretary.   Ejercite hasta que aumente la respiracin y el corazn palpite rpido.   Beba agua extra cuando ejercite.   No haga ejercicio Firefighter, sentirse mareado o que le falte mucho el aire.  La actividad fsica puede quemar alrededor de 150 caloras.  Correr 20 cuadras en 15 minutos.   Jugar vley durante 45 a 60 minutos.     Limpiar y encerar el auto durante 45 a 60 minutos.   Jugar ftbol americano de toque.   Caminar 25 cuadras en 35 minutos.   Empujar un cochecito 20 cuadras en 30 minutos.   Jugar baloncesto durante 30 minutos.   Rastrillar hojas secas durante 30 minutos.   Andar en bicicleta 80 cuadras en 30 minutos.   Caminar 30 cuadras en  30 minutos.   Bailar durante 30 minutos.   Quitar la nieve con una pala durante 15 minutos.   Nadar vigorosamente durante 20 minutos.   Subir escaleras durante 15 minutos.   Andar en bicicleta 60 cuadras durante 15 minutos.   Arreglar el jardn entre 30 y 45 minutos.   Saltar a la soga durante 15 minutos.   Limpiar vidrios o pisos durante 45 a 60 minutos.  Document Released: 08/03/2010 Document Revised: 01/09/2011 [facility] Patient Information 2012 [place], [place].    Primera semana de Junio venir en ayua a Development worker, community laboratories

## 2012-03-17 NOTE — Progress Notes (Signed)
Patient presented to the office today to discuss her recent lab work that was done at time of her annual exam October 15. Patient has not been seen the office for over 2 years. She still in the process of scheduling her screening colonoscopy and her mammogram which is overdue. The following was her recent lipid profile which was abnormal and the reason for her visit today:  Results for Natalie, [name] (MRN [id number]) as of 03/17/2012 11:43  Ref. Range 02/25/2012 10:18 03/04/2012 08:31  Cholesterol Latest Range: 0-200 mg/dL 409 (H) 811 (H)  Triglycerides Latest Range: <150 mg/dL  914 (H)  HDL Latest Range: >39 mg/dL  43  LDL (calc) Latest Range: 0-99 mg/dL  782 (H)  VLDL Latest Range: 0-40 mg/dL  39  Total CHOL/HDL Ratio No range found  5.3   We had a lengthy discussion of the above results which indicates that her total cholesterol, triglycerides, and LDL are all elevated. Patient has a BMI of 35.12 kg/meter squared (192 pounds). We discussed importance of a regular exercise and provided her with literature information on the subject as well as cholesterol-lowering diet in Spanish. We discussed the different types of statins its risks benefits pros and cons. She will be started on Lipitor 10 mg to take 1 by mouth daily. She will return back to the office in 6 months for SGOT/SGPT and fasting lipid profile. Patient has been postmenopausal for 2 years and has had no vasomotor symptoms and currently on no hormone replacement therapy. All the above was discussed in Spanish and literature information was provided all questions were answered and we'll follow accordingly.

## 2012-10-15 ENCOUNTER — Ambulatory Visit: Payer: BC Managed Care – PPO | Admitting: Gynecology

## 2012-11-06 ENCOUNTER — Ambulatory Visit: Payer: Self-pay | Admitting: Gynecology

## 2012-12-04 ENCOUNTER — Ambulatory Visit: Payer: Self-pay | Admitting: Gynecology

## 2013-06-22 ENCOUNTER — Encounter: Payer: Self-pay | Admitting: Gynecology

## 2013-06-22 ENCOUNTER — Ambulatory Visit (INDEPENDENT_AMBULATORY_CARE_PROVIDER_SITE_OTHER): Payer: BC Managed Care – PPO | Admitting: Gynecology

## 2013-06-22 ENCOUNTER — Other Ambulatory Visit: Payer: Self-pay | Admitting: Gynecology

## 2013-06-22 VITALS — BP 120/82

## 2013-06-22 DIAGNOSIS — N9089 Other specified noninflammatory disorders of vulva and perineum: Secondary | ICD-10-CM

## 2013-06-22 NOTE — Addendum Note (Signed)
Addended by: Bertram SavinGONZALEZ-Amiel Sharrow A on: 06/22/2013 03:20 PM   Modules accepted: Orders

## 2013-06-22 NOTE — Progress Notes (Signed)
   Patient is a 53 year old that presented to the office today concerned about a lesion that she had noted on her vulva when she was bleeding. Patient is married in a monogamous relationship. Patient denies any prior history of any STDs. Patient denies any lesions seen on the patient's husband genital area.  External genital exam: Physical Exam  Genitourinary:      the area was cleansed with Betadine solution. One percent lidocaine was infiltrated at the base for a total of 3 cc. The lesion was grasped with a fine pick up and the base was excised with a scalpel. Tissue is submitted for histological evaluation. The base was cauterized with silver nitrate. Patient tolerated procedure well and will be notified later in the week with the result. She is scheduled for her annual exam next month. Requisition was provided for her to obtain her mammogram which is overdue.  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

## 2013-07-20 ENCOUNTER — Encounter: Payer: Self-pay | Admitting: Gynecology

## 2013-07-23 ENCOUNTER — Encounter: Payer: Self-pay | Admitting: Gynecology

## 2014-03-14 ENCOUNTER — Encounter: Payer: Self-pay | Admitting: Gynecology

## 2016-09-25 ENCOUNTER — Encounter: Payer: Self-pay | Admitting: Gynecology

## 2017-09-10 ENCOUNTER — Encounter: Payer: Self-pay | Admitting: Medical

## 2017-09-10 ENCOUNTER — Ambulatory Visit: Payer: BC Managed Care – PPO | Admitting: Medical

## 2017-09-10 VITALS — BP 131/77 | HR 65 | Temp 97.9°F | Resp 16 | Ht 63.0 in | Wt 202.4 lb

## 2017-09-10 DIAGNOSIS — R21 Rash and other nonspecific skin eruption: Secondary | ICD-10-CM | POA: Diagnosis not present

## 2017-09-10 MED ORDER — CLOTRIMAZOLE-BETAMETHASONE 1-0.05 % EX CREA: 1.0000 "application " | TOPICAL_CREAM | Freq: Two times a day (BID) | CUTANEOUS | 0 refills | Status: DC

## 2017-09-10 NOTE — Patient Instructions (Addendum)
For your left foot rash, I want you to use Lotrisone cream twice daily and apply either Aveeno or Lubriderm once daily/midday.  This area of rash may be from allergic reaction but has some eczema type features on inspection.  Follow-up in 10 days or as needed.  If the area does not have some significant improvement on follow-up then would refer to dermatologist.

## 2017-09-10 NOTE — Progress Notes (Signed)
Subjective:    Patient ID: Natalie [name], female    DOB: [birth year], 57 y.o.   MRN: [id number]  HPI   Pt in with some rash on her distal foot has been itching for weeks.   Pt has been using some vaseline to area and states it feels dry. Pt not sure when it changed to darker color.  Pt does not remember any injury or any bite.    Review of Systems  Constitutional: Negative for chills, fatigue and fever.  Respiratory: Negative for cough, chest tightness, shortness of breath and wheezing.   Cardiovascular: Negative for chest pain and palpitations.  Gastrointestinal: Negative for abdominal distention and abdominal pain.  Genitourinary: Negative for difficulty urinating and dysuria.  Musculoskeletal: Negative for back pain and myalgias.  Skin: Negative for pallor and rash.  Neurological: Negative for dizziness, syncope, weakness and headaches.  Hematological: Negative for adenopathy. Does not bruise/bleed easily.  Psychiatric/Behavioral: Negative for behavioral problems and confusion.   No past medical history on file.   Social History   Socioeconomic History  . Marital status: Married    Spouse name: Not on file  . Number of children: Not on file  . Years of education: Not on file  . Highest education level: Not on file  Occupational History  . Not on file  Social Needs  . Financial resource strain: Not on file  . Food insecurity:    Worry: Not on file    Inability: Not on file  . Transportation needs:    Medical: Not on file    Non-medical: Not on file  Tobacco Use  . Smoking status: Never Smoker  . Smokeless tobacco: Never Used  Substance and Sexual Activity  . Alcohol use: Yes    Comment: social  . Drug use: Not on file  . Sexual activity: Yes  Lifestyle  . Physical activity:    Days per week: Not on file    Minutes per session: Not on file  . Stress: Not on file  Relationships  . Social connections:    Talks on phone: Not on file    Gets together:  Not on file    Attends religious service: Not on file    Active member of club or organization: Not on file    Attends meetings of clubs or organizations: Not on file    Relationship status: Not on file  . Intimate partner violence:    Fear of current or ex partner: Not on file    Emotionally abused: Not on file    Physically abused: Not on file    Forced sexual activity: Not on file  Other Topics Concern  . Not on file  Social History Narrative  . Not on file    Past Surgical History:  Procedure Laterality Date  . TUBAL LIGATION      Family History  Problem Relation Age of Onset  . Hypertension Mother   . Hypertension Father   . Diabetes Father     No Known Allergies  Current Outpatient Medications on File Prior to Visit  Medication Sig Dispense Refill  . atorvastatin (LIPITOR) 10 MG tablet Take 1 tablet (10 mg total) by mouth daily. 30 tablet 11  . calcium carbonate (OS-CAL) 600 MG TABS Take 600 mg by mouth 2 (two) times daily with a meal.    . fish oil-omega-3 fatty acids 1000 MG capsule Take 2 g by mouth daily.    . Multiple Vitamin (MULTIVITAMIN) tablet Take 1 tablet  by mouth daily.     No current facility-administered medications on file prior to visit.     BP 131/77   Pulse 65   Temp 97.9 F (36.6 C) (Oral)   Resp 16   Ht  (1.6 m)   Wt 202 lb 6.4 oz (91.8 kg)   LMP 02/24/2010   SpO2 100%   BMI 35.85 kg/m      Objective:   Physical Exam   General- No acute distress. Pleasant patient.  Left foot- distal aspect at base of toes 2.5 cm x 2.5 cm. Areas is very dry and hyperpigmented.  On palpation and inspection has slightly lichenified appearance.  Right ankle-lateral aspect thin faint rash 1 cm x 0.5 cm.  This area slightly pink but is not dry and does not have lichenified appearance.       Assessment & Plan:  For your left foot rash, I want you to use Lotrisone cream twice daily and apply either Aveeno or Lubriderm once daily/midday.  This  area of rash may be from allergic reaction but has some eczema type features on inspection.  Follow-up in 10 days or as needed.  If the area does not have some significant improvement on follow-up then would refer to dermatologist.  Esperanza Richters, PA-C

## 2017-09-24 ENCOUNTER — Encounter: Payer: Self-pay | Admitting: Medical

## 2017-09-24 ENCOUNTER — Ambulatory Visit: Payer: BC Managed Care – PPO | Admitting: Medical

## 2017-09-24 VITALS — BP 132/59 | HR 69 | Temp 97.6°F | Resp 16 | Ht 63.0 in | Wt 201.2 lb

## 2017-09-24 DIAGNOSIS — R21 Rash and other nonspecific skin eruption: Secondary | ICD-10-CM

## 2017-09-24 MED ORDER — CLOTRIMAZOLE-BETAMETHASONE 1-0.05 % EX CREA: 1.0000 "application " | TOPICAL_CREAM | Freq: Two times a day (BID) | CUTANEOUS | 0 refills | Status: DC

## 2017-09-24 NOTE — Progress Notes (Signed)
Subjective:    Patient ID: Natalie [name], female    DOB: 10/12/1960, 57 y.o.   MRN: [id number]  HPI   Pt in for follow up on her left foot rash. Less thick and not itching. Mild decrease in surface area. Much lighter in appearance. About 50% lighter. Pt is using lotrisone cream and has a little left.   Review of Systems  Constitutional: Negative for chills, fatigue and fever.  Cardiovascular: Negative for chest pain and palpitations.  Skin: Positive for rash.    No past medical history on file.   Social History   Socioeconomic History  . Marital status: Married    Spouse name: Not on file  . Number of children: Not on file  . Years of education: Not on file  . Highest education level: Not on file  Occupational History  . Not on file  Social Needs  . Financial resource strain: Not on file  . Food insecurity:    Worry: Not on file    Inability: Not on file  . Transportation needs:    Medical: Not on file    Non-medical: Not on file  Tobacco Use  . Smoking status: Never Smoker  . Smokeless tobacco: Never Used  Substance and Sexual Activity  . Alcohol use: Yes    Comment: social  . Drug use: Not on file  . Sexual activity: Yes  Lifestyle  . Physical activity:    Days per week: Not on file    Minutes per session: Not on file  . Stress: Not on file  Relationships  . Social connections:    Talks on phone: Not on file    Gets together: Not on file    Attends religious service: Not on file    Active member of club or organization: Not on file    Attends meetings of clubs or organizations: Not on file    Relationship status: Not on file  . Intimate partner violence:    Fear of current or ex partner: Not on file    Emotionally abused: Not on file    Physically abused: Not on file    Forced sexual activity: Not on file  Other Topics Concern  . Not on file  Social History Narrative  . Not on file    Past Surgical History:  Procedure Laterality Date  .  TUBAL LIGATION      Family History  Problem Relation Age of Onset  . Hypertension Mother   . Hypertension Father   . Diabetes Father     No Known Allergies  Current Outpatient Medications on File Prior to Visit  Medication Sig Dispense Refill  . atorvastatin (LIPITOR) 10 MG tablet Take 1 tablet (10 mg total) by mouth daily. 30 tablet 11  . calcium carbonate (OS-CAL) 600 MG TABS Take 600 mg by mouth 2 (two) times daily with a meal.    . clotrimazole-betamethasone (LOTRISONE) cream Apply 1 application topically 2 (two) times daily. 30 g 0  . fish oil-omega-3 fatty acids 1000 MG capsule Take 2 g by mouth daily.    . Multiple Vitamin (MULTIVITAMIN) tablet Take 1 tablet by mouth daily.     No current facility-administered medications on file prior to visit.     BP (!) 132/59   Pulse 69   Temp 97.6 F (36.4 C) (Oral)   Resp 16   Ht  (1.6 m)   Wt 201 lb 3.2 oz (91.3 kg)   LMP 02/24/2010  SpO2 100%   BMI 35.64 kg/m       Objective:   Physical Exam  General- No acute distress. Pleasant patient.  Left foot- distal aspect at base of toes 2.0 cm x 2.0 cm. Areas is moist now and much less hyperpigmented.  On palpation and inspection has much less lichenified appearance.  Right ankle-lateral aspect thin faint rash. Almost resolved.     Assessment & Plan:  Much improved rash on both areas. But not complete improvement. Will rx refill and use lotirsone for another 2 weeks. If not 100% improvement then will refer to dermatologist.  For your left foot rash, I want you to use Lotrisone cream twice daily and apply either Aveeno or Lubriderm once daily/midday.  This area of rash may be from allergic reaction but has some eczema type features on inspection.  Follow-up in 2 weeks or as needed(can call as well give report and make referral to derm at that work)

## 2017-09-24 NOTE — Patient Instructions (Addendum)
Much improved rash on both areas. But not complete improvement. Will rx refill and use lotirsone for another 2 weeks. If not 100% improvement then will refer to dermatologist.  For your left foot rash, I want you to use Lotrisone cream twice daily and apply either Aveeno or Lubriderm once daily/midday.  This area of rash may be from allergic reaction but has some eczema type features on inspection.  Follow-up in 2 weeks or as needed(can call as well give report and make referral to derm at that work)

## 2018-02-27 ENCOUNTER — Telehealth: Payer: Self-pay | Admitting: Internal Medicine

## 2018-02-27 NOTE — Telephone Encounter (Signed)
Copied from CRM [id number]. Topic: Quick Communication - See Telephone Encounter >> Feb 27, 2018  9:15 AM Valentina Lucks wrote: CRM for notification. See Telephone encounter for: 02/27/18.   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 Pt states that she had her test done 15 years ago and that she would like a letter printed out for her to take to her new job. Please advise.

## 2018-02-27 NOTE — Telephone Encounter (Signed)
LVM in spanish indicating pt needing to schedule a nv for her TB test to be done.

## 2018-02-27 NOTE — Telephone Encounter (Signed)
Her new employer will probably require a new TB test within the last year. Okay to schedule nurse visit at her convenience.

## 2018-08-28 ENCOUNTER — Telehealth: Payer: Self-pay

## 2018-08-28 NOTE — Telephone Encounter (Signed)
LVM for pt to schedule a VOV with provider, pt is due for fu appt with provider.

## 2018-08-28 NOTE — Telephone Encounter (Signed)
Ruth/Jackie-- can you contact Pt- needs visit w/ PCP- last ov 2012, has been seeing Ramon Dredge for acute. Virtual visit?

## 2018-12-11 ENCOUNTER — Encounter: Payer: Self-pay | Admitting: Medical

## 2018-12-11 ENCOUNTER — Ambulatory Visit (INDEPENDENT_AMBULATORY_CARE_PROVIDER_SITE_OTHER): Payer: BC Managed Care – PPO | Admitting: Medical

## 2018-12-11 ENCOUNTER — Other Ambulatory Visit: Payer: Self-pay

## 2018-12-11 VITALS — BP 129/58 | HR 59 | Temp 98.2°F | Resp 16 | Ht 63.0 in | Wt 193.1 lb

## 2018-12-11 DIAGNOSIS — Z113 Encounter for screening for infections with a predominantly sexual mode of transmission: Secondary | ICD-10-CM

## 2018-12-11 DIAGNOSIS — Z1211 Encounter for screening for malignant neoplasm of colon: Secondary | ICD-10-CM

## 2018-12-11 DIAGNOSIS — Z0000 Encounter for general adult medical examination without abnormal findings: Secondary | ICD-10-CM | POA: Diagnosis not present

## 2018-12-11 NOTE — Progress Notes (Signed)
Subjective:    Patient ID: Natalie [name], female    DOB: 23-Aug-1960, 58 y.o.   MRN: [id number]  HPI  Pt former pt of Dr. Larose Kells. 8 years ago.  She needs physical and get re-established.(i saw her acute complaint in may, 2019)  No recent cpe.  Pt only takes calcium otc.  Pt had last pap in [place] one year ago. She states told normal but does not have copy. She will try to get.  Pt had mammogram done in [place] last year as well. Told negative.  Pt is not fasting.  Pt works [employer]. She does exercise. She indicates moderate healthy, one cup coffee a day. Married- 2 girls 57 yo and 57 yo. Nonsmoker. Very rare alcohol.  Pt not sure when her last tetanus was? Her former gyn retired and she not sure if vaccine available.    Review of Systems  Constitutional: Negative for chills, fatigue and fever.  HENT: Negative for congestion, ear pain, postnasal drip and sinus pain.   Respiratory: Negative for cough, chest tightness, shortness of breath and wheezing.   Cardiovascular: Negative for chest pain and palpitations.  Gastrointestinal: Negative for abdominal pain, constipation, nausea and vomiting.  Genitourinary: Negative for dysuria, flank pain and frequency.  Musculoskeletal: Negative for back pain.  Skin: Negative for rash.  Neurological: Negative for dizziness, speech difficulty, weakness, numbness and headaches.  Hematological: Negative for adenopathy. Does not bruise/bleed easily.  Psychiatric/Behavioral: Negative for behavioral problems and suicidal ideas. The patient is not nervous/anxious.     No past medical history on file.   Social History   Socioeconomic History  . Marital status: Married    Spouse name: Not on file  . Number of children: Not on file  . Years of education: Not on file  . Highest education level: Not on file  Occupational History  . Not on file  Social Needs  . Financial resource strain: Not on file  . Food insecurity   Worry: Not on file    Inability: Not on file  . Transportation needs    Medical: Not on file    Non-medical: Not on file  Tobacco Use  . Smoking status: Never Smoker  . Smokeless tobacco: Never Used  Substance and Sexual Activity  . Alcohol use: Yes    Comment: social  . Drug use: Not on file  . Sexual activity: Yes  Lifestyle  . Physical activity    Days per week: Not on file    Minutes per session: Not on file  . Stress: Not on file  Relationships  . Social Herbalist on phone: Not on file    Gets together: Not on file    Attends religious service: Not on file    Active member of club or organization: Not on file    Attends meetings of clubs or organizations: Not on file    Relationship status: Not on file  . Intimate partner violence    Fear of current or ex partner: Not on file    Emotionally abused: Not on file    Physically abused: Not on file    Forced sexual activity: Not on file  Other Topics Concern  . Not on file  Social History Narrative  . Not on file    Past Surgical History:  Procedure Laterality Date  . TUBAL LIGATION      Family History  Problem Relation Age of Onset  . Hypertension Mother   .  Hypertension Father   . Diabetes Father     No Known Allergies  No current outpatient medications on file prior to visit.   No current facility-administered medications on file prior to visit.     BP (!) 129/58   Pulse (!) 59   Temp 98.2 F (36.8 C) (Oral)   Resp 16   Ht 5\' 3"  (1.6 m)   Wt 193 lb 1.6 oz (87.6 kg)   LMP 02/24/2010   SpO2 100%   BMI 34.21 kg/m       Objective:   Physical Exam  General Mental Status- Alert. General Appearance- Not in acute distress.   Skin General: Color- Normal Color. Moisture- Normal Moisture. No worrisome lesions.  Neck Carotid Arteries- Normal color. Moisture- Normal Moisture. No carotid bruits. No JVD.  Chest and Lung Exam Auscultation: Breath Sounds:-Normal.  Cardiovascular  Auscultation:Rythm- Regular. Murmurs & Other Heart Sounds:Auscultation of the heart reveals- No Murmurs.  Abdomen Inspection:-Inspeection Normal. Palpation/Percussion:Note:No mass. Palpation and Percussion of the abdomen reveal- Non Tender, Non Distended + BS, no rebound or guarding.   Neurologic Cranial Nerve exam:- CN III-XII intact(No nystagmus), symmetric smile. Strength:- 5/5 equal and symmetric strength both upper and lower extremities.      Assessment & Plan:  For you wellness exam today I have ordered cbc, cmp, lipid panel  and hiv.   Check on tdap if done at gyn office. Also get flu vaccine September if possible(or October)  Recommend exercise and healthy diet.  We will let you know lab results as they come in.  Follow up date appointment will be determined after lab review.   Refer to gi for screening colonoscopy  Follow up date to be determined after lab review.  Schedule fasting labs on way out.  Esperanza RichtersEdward Chas Axel, PA-C

## 2018-12-11 NOTE — Patient Instructions (Addendum)
For you wellness exam today I have ordered cbc, cmp, lipid panel  and hiv.   Check on tdap if done at gyn office. Also get flu vaccine September if possible(or October)  Recommend exercise and healthy diet.  We will let you know lab results as they come in.  Follow up date appointment will be determined after lab review.   Refer to gi for screening colonoscopy  Follow up date to be determined after lab review.  Schedule fasting labs on way out.   Preventive Care 90-65 Years Old, Female Preventive care refers to visits with your health care provider and lifestyle choices that can promote health and wellness. This includes:  A yearly physical exam. This may also be called an annual well check.  Regular dental visits and eye exams.  Immunizations.  Screening for certain conditions.  Healthy lifestyle choices, such as eating a healthy diet, getting regular exercise, not using drugs or products that contain nicotine and tobacco, and limiting alcohol use. What can I expect for my preventive care visit? Physical exam Your health care provider will check your:  Height and weight. This may be used to calculate body mass index (BMI), which tells if you are at a healthy weight.  Heart rate and blood pressure.  Skin for abnormal spots. Counseling Your health care provider may ask you questions about your:  Alcohol, tobacco, and drug use.  Emotional well-being.  Home and relationship well-being.  Sexual activity.  Eating habits.  Work and work Statistician.  Method of birth control.  Menstrual cycle.  Pregnancy history. What immunizations do I need?  Influenza (flu) vaccine  This is recommended every year. Tetanus, diphtheria, and pertussis (Tdap) vaccine  You may need a Td booster every 10 years. Varicella (chickenpox) vaccine  You may need this if you have not been vaccinated. Zoster (shingles) vaccine  You may need this after age 59. Measles, mumps, and  rubella (MMR) vaccine  You may need at least one dose of MMR if you were born in 1957 or later. You may also need a second dose. Pneumococcal conjugate (PCV13) vaccine  You may need this if you have certain conditions and were not previously vaccinated. Pneumococcal polysaccharide (PPSV23) vaccine  You may need one or two doses if you smoke cigarettes or if you have certain conditions. Meningococcal conjugate (MenACWY) vaccine  You may need this if you have certain conditions. Hepatitis A vaccine  You may need this if you have certain conditions or if you travel or work in places where you may be exposed to hepatitis A. Hepatitis B vaccine  You may need this if you have certain conditions or if you travel or work in places where you may be exposed to hepatitis B. Haemophilus influenzae type b (Hib) vaccine  You may need this if you have certain conditions. Human papillomavirus (HPV) vaccine  If recommended by your health care provider, you may need three doses over 6 months. You may receive vaccines as individual doses or as more than one vaccine together in one shot (combination vaccines). Talk with your health care provider about the risks and benefits of combination vaccines. What tests do I need? Blood tests  Lipid and cholesterol levels. These may be checked every 5 years, or more frequently if you are over 15 years old.  Hepatitis C test.  Hepatitis B test. Screening  Lung cancer screening. You may have this screening every year starting at age 52 if you have a 30-pack-year history of  smoking and currently smoke or have quit within the past 15 years.  Colorectal cancer screening. All adults should have this screening starting at age 45 and continuing until age 39. Your health care provider may recommend screening at age 2 if you are at increased risk. You will have tests every 1-10 years, depending on your results and the type of screening test.  Diabetes screening. This  is done by checking your blood sugar (glucose) after you have not eaten for a while (fasting). You may have this done every 1-3 years.  Mammogram. This may be done every 1-2 years. Talk with your health care provider about when you should start having regular mammograms. This may depend on whether you have a family history of breast cancer.  BRCA-related cancer screening. This may be done if you have a family history of breast, ovarian, tubal, or peritoneal cancers.  Pelvic exam and Pap test. This may be done every 3 years starting at age 24. Starting at age 36, this may be done every 5 years if you have a Pap test in combination with an HPV test. Other tests  Sexually transmitted disease (STD) testing.  Bone density scan. This is done to screen for osteoporosis. You may have this scan if you are at high risk for osteoporosis. Follow these instructions at home: Eating and drinking  Eat a diet that includes fresh fruits and vegetables, whole grains, lean protein, and low-fat dairy.  Take vitamin and mineral supplements as recommended by your health care provider.  Do not drink alcohol if: ? Your health care provider tells you not to drink. ? You are pregnant, may be pregnant, or are planning to become pregnant.  If you drink alcohol: ? Limit how much you have to 0-1 drink a day. ? Be aware of how much alcohol is in your drink. In the [place] one drink equals one 12 oz bottle of beer (355 mL), one 5 oz glass of wine (148 mL), or one 1 oz glass of hard liquor (44 mL). Lifestyle  Take daily care of your teeth and gums.  Stay active. Exercise for at least 30 minutes on 5 or more days each week.  Do not use any products that contain nicotine or tobacco, such as cigarettes, e-cigarettes, and chewing tobacco. If you need help quitting, ask your health care provider.  If you are sexually active, practice safe sex. Use a condom or other form of birth control (contraception) in order to prevent  pregnancy and STIs (sexually transmitted infections).  If told by your health care provider, take low-dose aspirin daily starting at age 34. What's next?  Visit your health care provider once a year for a well check visit.  Ask your health care provider how often you should have your eyes and teeth checked.  Stay up to date on all vaccines. This information is not intended to replace advice given to you by your health care provider. Make sure you discuss any questions you have with your health care provider. Document Released: 05/26/2015 Document Revised: 01/08/2018 Document Reviewed: 01/08/2018 Elsevier Patient Education  2020 [employer].

## 2018-12-18 ENCOUNTER — Other Ambulatory Visit: Payer: Self-pay

## 2018-12-18 ENCOUNTER — Other Ambulatory Visit (INDEPENDENT_AMBULATORY_CARE_PROVIDER_SITE_OTHER): Payer: BC Managed Care – PPO

## 2018-12-18 DIAGNOSIS — Z0000 Encounter for general adult medical examination without abnormal findings: Secondary | ICD-10-CM

## 2018-12-18 DIAGNOSIS — Z113 Encounter for screening for infections with a predominantly sexual mode of transmission: Secondary | ICD-10-CM

## 2018-12-18 LAB — CBC WITH DIFFERENTIAL/PLATELET
Basophils Absolute: 0 10*3/uL (ref 0.0–0.1)
Basophils Relative: 0.9 % (ref 0.0–3.0)
Eosinophils Absolute: 0 10*3/uL (ref 0.0–0.7)
Eosinophils Relative: 0.9 % (ref 0.0–5.0)
HCT: 41.7 % (ref 36.0–46.0)
Hemoglobin: 13.8 g/dL (ref 12.0–15.0)
Lymphocytes Relative: 30.7 % (ref 12.0–46.0)
Lymphs Abs: 1.7 10*3/uL (ref 0.7–4.0)
MCHC: 33 g/dL (ref 30.0–36.0)
MCV: 88.4 fl (ref 78.0–100.0)
Monocytes Absolute: 0.4 10*3/uL (ref 0.1–1.0)
Monocytes Relative: 7.4 % (ref 3.0–12.0)
Neutro Abs: 3.3 10*3/uL (ref 1.4–7.7)
Neutrophils Relative %: 60.1 % (ref 43.0–77.0)
Platelets: 300 10*3/uL (ref 150.0–400.0)
RBC: 4.71 Mil/uL (ref 3.87–5.11)
RDW: 13.1 % (ref 11.5–15.5)
WBC: 5.5 10*3/uL (ref 4.0–10.5)

## 2018-12-18 LAB — COMPREHENSIVE METABOLIC PANEL
ALT: 19 U/L (ref 0–35)
AST: 18 U/L (ref 0–37)
Albumin: 4.2 g/dL (ref 3.5–5.2)
Alkaline Phosphatase: 62 U/L (ref 39–117)
BUN: 16 mg/dL (ref 6–23)
CO2: 26 mEq/L (ref 19–32)
Calcium: 9.8 mg/dL (ref 8.4–10.5)
Chloride: 105 mEq/L (ref 96–112)
Creatinine, Ser: 0.7 mg/dL (ref 0.40–1.20)
GFR: 86.19 mL/min (ref >60.00)
Glucose, Bld: 111 mg/dL — ABNORMAL HIGH (ref 70–99)
Potassium: 3.9 mEq/L (ref 3.5–5.1)
Sodium: 139 mEq/L (ref 135–145)
Total Bilirubin: 0.9 mg/dL (ref 0.2–1.2)
Total Protein: 7.3 g/dL (ref 6.0–8.3)

## 2018-12-19 LAB — HIV ANTIBODY (ROUTINE TESTING W REFLEX): HIV 1&2 Ab, 4th Generation: NONREACTIVE

## 2019-02-04 ENCOUNTER — Encounter: Payer: Self-pay | Admitting: Internal Medicine

## 2019-08-27 ENCOUNTER — Ambulatory Visit: Payer: BC Managed Care – PPO | Attending: Internal Medicine

## 2019-08-27 ENCOUNTER — Ambulatory Visit: Payer: BC Managed Care – PPO

## 2019-08-27 DIAGNOSIS — Z23 Encounter for immunization: Secondary | ICD-10-CM

## 2019-08-27 NOTE — Progress Notes (Signed)
   Covid-19 Vaccination Clinic  Name:  Laniece Hornbaker    MRN: [id number] DOB: 1961/03/24  08/27/2019  Ms. Martinez-Vesga was observed post Covid-19 immunization for 15 minutes without incident. She was provided with Vaccine Information Sheet and instruction to access the V-Safe system.   Ms. Lipuma was instructed to call 911 with any severe reactions post vaccine: Marland Kitchen Difficulty breathing  . Swelling of face and throat  . A fast heartbeat  . A bad rash all over body  . Dizziness and weakness   Immunizations Administered    Name Date Dose VIS Date Route   Pfizer COVID-19 Vaccine 08/27/2019 12:46 PM 0.3 mL 04/23/2019 Intramuscular   Manufacturer: [employer], [employer]   Lot: [id number]   NDC: [address]-4

## 2019-09-21 ENCOUNTER — Ambulatory Visit: Payer: BC Managed Care – PPO | Attending: Internal Medicine

## 2019-09-21 DIAGNOSIS — Z23 Encounter for immunization: Secondary | ICD-10-CM

## 2019-09-21 NOTE — Progress Notes (Signed)
   Covid-19 Vaccination Clinic  Name:  Kadeja Granada    MRN: [id number] DOB: 05/13/1961  09/21/2019  Ms. Martinez-Vesga was observed post Covid-19 immunization for 15 minutes without incident. She was provided with Vaccine Information Sheet and instruction to access the V-Safe system.   Ms. Ghanem was instructed to call 911 with any severe reactions post vaccine: Marland Kitchen Difficulty breathing  . Swelling of face and throat  . A fast heartbeat  . A bad rash all over body  . Dizziness and weakness   Immunizations Administered    Name Date Dose VIS Date Route   Pfizer COVID-19 Vaccine 09/21/2019 12:44 PM 0.3 mL 07/07/2018 Intramuscular   Manufacturer: [employer], [employer]   Lot: [id number]   NDC: [address]-1
